# Patient Record
Sex: Male | Born: 2000
Health system: Southern US, Community
[De-identification: ages and names within clinical notes are randomized; demographics above are authoritative.]

---
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
## 2020-04-20 ENCOUNTER — Other Ambulatory Visit: Payer: No Typology Code available for payment source

## 2020-04-20 DIAGNOSIS — Z20822 Contact with and (suspected) exposure to covid-19: Secondary | ICD-10-CM

## 2020-04-22 LAB — SARS-COV-2, NAA 2 DAY TAT

## 2020-04-22 LAB — NOVEL CORONAVIRUS, NAA: SARS-CoV-2, NAA: NOT DETECTED

## 2022-11-25 ENCOUNTER — Ambulatory Visit
Admission: RE | Admit: 2022-11-25 | Discharge: 2022-11-25 | Disposition: A | Discharge status: Home / Self Care | Payer: 59 | Source: Ambulatory Visit | Attending: Family Medicine | Admitting: Family Medicine

## 2022-11-25 VITALS — BP 140/100 | HR 97 | Temp 99.9°F | Resp 17

## 2022-11-25 DIAGNOSIS — J029 Acute pharyngitis, unspecified: Secondary | ICD-10-CM

## 2022-11-25 DIAGNOSIS — R6889 Other general symptoms and signs: Secondary | ICD-10-CM

## 2022-11-25 DIAGNOSIS — R509 Fever, unspecified: Secondary | ICD-10-CM

## 2022-11-25 DIAGNOSIS — R059 Cough, unspecified: Secondary | ICD-10-CM

## 2022-11-25 LAB — POC INFLUENZA A AND B ANTIGEN (URGENT CARE ONLY)
Influenza A Ag: NEGATIVE
Influenza B Ag: NEGATIVE

## 2022-11-25 LAB — POC SARS CORONAVIRUS 2 AG -  ED: SARS Coronavirus 2 Ag: NEGATIVE

## 2022-11-25 MED ORDER — PROMETHAZINE-DM 6.25-15 MG/5ML PO SYRP: 5.0000 mL | ORAL_SOLUTION | Freq: Two times a day (BID) | ORAL | 0 refills | Status: AC | PRN

## 2022-11-25 MED ORDER — BENZONATATE 200 MG PO CAPS: 200.0000 mg | ORAL_CAPSULE | Freq: Three times a day (TID) | ORAL | 0 refills | Status: AC | PRN

## 2022-11-25 NOTE — ED Triage Notes (Signed)
Pt c/o cough, headache, fever, and bodyaches x 3 days. Tmax fever 101.4 yesterday evening. COVID test neg at home last night. Taking tylenol/ibuprofen and mucinex nighttime prn.

## 2022-11-25 NOTE — Discharge Instructions (Addendum)
Advised patient may take Tessalon daily or as needed for cough.  Advised may use Promethazine DM at night prior to sleep for cough due to sedative effects.  Advised patient/mother may take Tylenol 1 g every 6 hours for fever.  Encouraged increase daily water intake to 64 ounces per day while taking these medications.

## 2022-11-25 NOTE — ED Provider Notes (Signed)
[facility]    CSN: [id number] Arrival date & time: 11/25/22  1842      History   Chief Complaint Chief Complaint  Patient presents with   Headache   Fever   Generalized Body Aches   Cough    HPI Jeffrey Hensley is a 22 y.o. male.   HPI 22 year old male presents with cough, headache, fever and bodyaches for 3 days with temperature max being 101.4 yesterday evening.  Reports home COVID-19 test was negative. Patient is accompanied by his Mother this evening.  Past Medical History:  Diagnosis Date   Asthma     Patient Active Problem List   Diagnosis Date Noted   Ingrown toenail 11/25/2016   OTITIS EXTERNA, ACUTE NEC 03/09/2007   ASTHMA, UNSPECIFIED 09/24/2006    History reviewed. No pertinent surgical history.     Home Medications    Prior to Admission medications   Medication Sig Start Date End Date Taking? Authorizing Provider  benzonatate (TESSALON) 200 MG capsule Take 1 capsule (200 mg total) by mouth 3 (three) times daily as needed for up to 7 days. 11/25/22 12/02/22 Yes Trevor Iha, FNP  promethazine-dextromethorphan (PROMETHAZINE-DM) 6.25-15 MG/5ML syrup Take 5 mLs by mouth 2 (two) times daily as needed for cough. 11/25/22  Yes Trevor Iha, FNP  albuterol (PROVENTIL HFA;VENTOLIN HFA) 108 (90 Base) MCG/ACT inhaler Inhale into the lungs.    [provider]  CHILDRENS LORATADINE PO Take 1 tablet by mouth daily as needed. For allergies    [provider]  loratadine (CLARITIN) 10 MG tablet Take 1 tablet (10 mg total) by mouth daily. 03/13/12 03/13/13  [name], NP    Family History History reviewed. No pertinent family history.  Social History Social History   Tobacco Use   Smoking status: Never   Smokeless tobacco: Never  Substance Use Topics   Alcohol use: Never   Drug use: Never     Allergies   Amoxicillin   Review of Systems Review of Systems  Constitutional:  Positive for fever.  Respiratory:  Positive  for cough.   Musculoskeletal:  Positive for arthralgias and myalgias.  All other systems reviewed and are negative.    Physical Exam Triage Vital Signs ED Triage Vitals  Enc Vitals Group     BP      Pulse      Resp      Temp      Temp src      SpO2      Weight      Height      Head Circumference      Peak Flow      Pain Score      Pain Loc      Pain Edu?      Excl. in GC?    No data found.  Updated Vital Signs BP (!) 140/100 (BP Location: Left Arm)   Pulse 97   Temp 99.9 F (37.7 C) (Oral)   Resp 17   SpO2 96%    Physical Exam Vitals and nursing note reviewed.  Constitutional:      Appearance: Normal appearance. He is normal weight. He is ill-appearing.  HENT:     Head: Normocephalic and atraumatic.     Right Ear: Tympanic membrane, ear canal and external ear normal.     Left Ear: Tympanic membrane, ear canal and external ear normal.     Nose:     Comments: Turbinates are erythematous/edematous    Mouth/Throat:  Mouth: Mucous membranes are moist.     Pharynx: Oropharynx is clear.  Eyes:     Extraocular Movements: Extraocular movements intact.     Conjunctiva/sclera: Conjunctivae normal.     Pupils: Pupils are equal, round, and reactive to light.  Cardiovascular:     Rate and Rhythm: Normal rate and regular rhythm.     Pulses: Normal pulses.     Heart sounds: Normal heart sounds.  Pulmonary:     Effort: Pulmonary effort is normal.     Breath sounds: Normal breath sounds. No wheezing, rhonchi or rales.     Comments: Frequent nonproductive cough noted on exam Musculoskeletal:        General: Normal range of motion.     Cervical back: Normal range of motion and neck supple.  Skin:    General: Skin is warm and dry.  Neurological:     General: No focal deficit present.     Mental Status: He is alert and oriented to person, place, and time. Mental status is at baseline.      UC Treatments / Results  Labs (all labs ordered are listed, but only  abnormal results are displayed) Labs Reviewed  CULTURE, GROUP A STREP ([facility])  POC SARS CORONAVIRUS 2 AG -  ED  POC INFLUENZA A AND B ANTIGEN (URGENT CARE ONLY)  POCT RAPID STREP A (OFFICE)    EKG   Radiology No results found.  Procedures Procedures (including critical care time)  Medications Ordered in UC Medications - No data to display  Initial Impression / Assessment and Plan / UC Course  I have reviewed the triage vital signs and the nursing notes.  Pertinent labs & imaging results that were available during my care of the patient were reviewed by me and considered in my medical decision making (see chart for details).     MDM: 1.  Flulike symptoms-POCT influenza/COVID-19 negative; 2.  Cough-Rx'd Tessalon 3 times daily, as needed as needed for cough, Promethazine DM 6.25-15 mg / 5 mL syrup take 5 mL twice daily for cough; 3.  Pharyngitis-rapid strep negative, throat culture ordered. Advised patient may take Tessalon daily or as needed for cough.  Advised may use Promethazine DM at night prior to sleep for cough due to sedative effects.  4. Fever-Advised patient/mother may take Tylenol 1 g every 6 hours for fever.  Encouraged increase daily water intake to 64 ounces per day while taking these medications.  2-day work note provided to patient prior to discharge.  Patient discharged home, hemodynamically stable. Final Clinical Impressions(s) / UC Diagnoses   Final diagnoses:  Flu-like symptoms  Cough, unspecified type  Fever, unspecified  Acute pharyngitis, unspecified etiology     Discharge Instructions      Advised patient may take Tessalon daily or as needed for cough.  Advised may use Promethazine DM at night prior to sleep for cough due to sedative effects.  Advised patient/mother may take Tylenol 1 g every 6 hours for fever.  Encouraged increase daily water intake to 64 ounces per day while taking these medications.     ED Prescriptions     Medication Sig  Dispense Auth. Provider   benzonatate (TESSALON) 200 MG capsule Take 1 capsule (200 mg total) by mouth 3 (three) times daily as needed for up to 7 days. 40 capsule Trevor Iha, FNP   promethazine-dextromethorphan (PROMETHAZINE-DM) 6.25-15 MG/5ML syrup Take 5 mLs by mouth 2 (two) times daily as needed for cough. 118 mL Trevor Iha, FNP  PDMP not reviewed this encounter.   Trevor Iha, FNP 11/25/22 2012

## 2023-08-04 ENCOUNTER — Other Ambulatory Visit: Payer: Self-pay

## 2023-08-04 ENCOUNTER — Emergency Department (HOSPITAL_BASED_OUTPATIENT_CLINIC_OR_DEPARTMENT_OTHER)
Admission: EM | Admit: 2023-08-04 | Discharge: 2023-08-04 | Disposition: A | Discharge status: Home / Self Care | Payer: Self-pay | Attending: Emergency Medicine | Admitting: Emergency Medicine

## 2023-08-04 ENCOUNTER — Other Ambulatory Visit (HOSPITAL_BASED_OUTPATIENT_CLINIC_OR_DEPARTMENT_OTHER): Payer: Self-pay

## 2023-08-04 ENCOUNTER — Encounter (HOSPITAL_BASED_OUTPATIENT_CLINIC_OR_DEPARTMENT_OTHER): Payer: Self-pay | Admitting: Emergency Medicine

## 2023-08-04 DIAGNOSIS — Z7951 Long term (current) use of inhaled steroids: Secondary | ICD-10-CM | POA: Insufficient documentation

## 2023-08-04 DIAGNOSIS — Z20822 Contact with and (suspected) exposure to covid-19: Secondary | ICD-10-CM | POA: Insufficient documentation

## 2023-08-04 DIAGNOSIS — J45909 Unspecified asthma, uncomplicated: Secondary | ICD-10-CM | POA: Insufficient documentation

## 2023-08-04 DIAGNOSIS — J069 Acute upper respiratory infection, unspecified: Secondary | ICD-10-CM | POA: Insufficient documentation

## 2023-08-04 LAB — RESP PANEL BY RT-PCR (RSV, FLU A&B, COVID)  RVPGX2
Influenza A by PCR: NEGATIVE
Influenza B by PCR: NEGATIVE
Resp Syncytial Virus by PCR: NEGATIVE
SARS Coronavirus 2 by RT PCR: NEGATIVE

## 2023-08-04 LAB — GROUP A STREP BY PCR: Group A Strep by PCR: NOT DETECTED

## 2023-08-04 MED ORDER — CETIRIZINE-PSEUDOEPHEDRINE ER 5-120 MG PO TB12
1.0000 | ORAL_TABLET | Freq: Every day | ORAL | 0 refills | Status: AC | PRN
  Filled 2023-08-04: qty 24, 24d supply, fill #0

## 2023-08-04 MED ORDER — TRIAMCINOLONE ACETONIDE 55 MCG/ACT NA AERO
2.0000 | INHALATION_SPRAY | Freq: Every day | NASAL | 12 refills | Status: AC
  Filled 2023-08-04: qty 16.9, 30d supply, fill #0

## 2023-08-04 MED ORDER — BENZONATATE 100 MG PO CAPS
100.0000 mg | ORAL_CAPSULE | Freq: Three times a day (TID) | ORAL | 0 refills | Status: AC | PRN
  Filled 2023-08-04: qty 21, 7d supply, fill #0

## 2023-08-04 NOTE — Discharge Instructions (Addendum)
 You tested negative for strep throat.  Suspect your symptoms are likely secondary to upper respiratory viral infection.  Follow MyChart for the results of your COVID, flu, RSV testing.  Will recommend treatment of your symptoms at home with allergy medicine such as Zyrtec /Claritin Blair.  Will also recommend nasal steroid spray such as Nasacort /Flonase.  You may take Tylenol/ibuprofen  for your body aches as well as any fever.  Will also send in cough suppressant to use as needed.  Please do not hesitate to return if the worrisome signs and symptoms we discussed become apparent.

## 2023-08-04 NOTE — ED Triage Notes (Signed)
 Cogh runny nose and fever and tired since sat

## 2023-08-04 NOTE — ED Provider Notes (Signed)
 [facility] Provider Note   CSN: [id number] Arrival date & time: 08/04/23  1138     History  Chief Complaint  Patient presents with   URI    Jeffrey Hensley is a 23 y.o. male.   URI    23 year old male presents emergency department complaints of cough, nasal congestion, sore throat, body aches.  Symptoms present since Saturday.  Patient with significant other who is ill with similar symptoms.  Reports multiple potential sick exposures.  Denies any fever, chest pain, shortness of breath, abdominal pain, nausea vomiting of your symptoms, change in bowel habits.  Has tried DayQuil with some relief of symptoms.  Presents emergency department for further assessment/evaluation.   Past medical history significant for asthma  Home Medications Prior to Admission medications   Medication Sig Start Date End Date Taking? Authorizing Provider  benzonatate  (TESSALON ) 100 MG capsule Take 1 capsule (100 mg total) by mouth 3 (three) times daily as needed. 08/04/23  Yes Silver Fell A, PA  cetirizine -pseudoephedrine  (ZYRTEC -D) 5-120 MG tablet Take 1 tablet by mouth daily as needed for allergies. 08/04/23  Yes Silver Fell A, PA  triamcinolone  (NASACORT ) 55 MCG/ACT AERO nasal inhaler Place 2 sprays into the nose daily. 08/04/23  Yes Silver Fell A, PA  albuterol  (PROVENTIL  HFA;VENTOLIN  HFA) 108 (90 Base) MCG/ACT inhaler Inhale into the lungs.    [provider]  loratadine  (CLARITIN ) 10 MG tablet Take 1 tablet (10 mg total) by mouth daily. 03/13/12 03/13/13  Towana Dedra CROME, NP  promethazine -dextromethorphan (PROMETHAZINE -DM) 6.25-15 MG/5ML syrup Take 5 mLs by mouth 2 (two) times daily as needed for cough. 11/25/22   Teddy Sharper, FNP      Allergies    Amoxicillin    Review of Systems   Review of Systems  All other systems reviewed and are negative.   Physical Exam Updated Vital Signs BP (!) 151/81 (BP Location: Right Arm)   Pulse 78    Temp 98.3 F (36.8 C) (Oral)   Resp 16   Ht 6' 2 (1.88 m)   Wt 119.7 kg   SpO2 95%   BMI 33.90 kg/m  Physical Exam Vitals and nursing note reviewed.  Constitutional:      General: He is not in acute distress.    Appearance: He is well-developed.  HENT:     Head: Normocephalic and atraumatic.     Right Ear: Tympanic membrane normal.     Left Ear: Tympanic membrane normal.     Nose: Congestion and rhinorrhea present.     Mouth/Throat:     Comments: Mild posterior pharyngeal erythema.  Uvula midline rise symmetric with phonation.  No sublingual or submandibular swelling.  Tonsils 1+ bilaterally without exudate.  No trismus. Eyes:     Conjunctiva/sclera: Conjunctivae normal.  Cardiovascular:     Rate and Rhythm: Normal rate and regular rhythm.     Pulses: Normal pulses.     Heart sounds: No murmur heard. Pulmonary:     Effort: Pulmonary effort is normal. No respiratory distress.     Breath sounds: Normal breath sounds. No wheezing, rhonchi or rales.  Abdominal:     Palpations: Abdomen is soft.     Tenderness: There is no abdominal tenderness.  Musculoskeletal:        General: No swelling.     Cervical back: Neck supple.  Skin:    General: Skin is warm and dry.     Capillary Refill: Capillary refill takes less than 2  seconds.  Neurological:     Mental Status: He is alert.  Psychiatric:        Mood and Affect: Mood normal.     ED Results / Procedures / Treatments   Labs (all labs ordered are listed, but only abnormal results are displayed) Labs Reviewed  RESP PANEL BY RT-PCR (RSV, FLU A&B, COVID)  [id number]  GROUP A STREP BY PCR    EKG None  Radiology No results found.  Procedures Procedures    Medications Ordered in ED Medications - No data to display  ED Course/ Medical Decision Making/ A&P                                 Medical Decision Making Risk OTC drugs. Prescription drug management.   This patient presents to the ED for concern of cough,  congestion, body aches, this involves an extensive number of treatment options, and is a complaint that carries with it a high risk of complications and morbidity.  The differential diagnosis includes, flu, RSV, viral URI, pneumonia, strep pharyngitis, Ludwig angina, PTA, other   Co morbidities that complicate the patient evaluation  See HPI   Additional history obtained:  Additional history obtained from EMR External records from outside source obtained and reviewed including hospital records   Lab Tests:  I Ordered, and personally interpreted labs.  The pertinent results include: Strep, COVID, flu, RSV negative   Imaging Studies ordered:  N/a   Cardiac Monitoring: / EKG:  The patient was maintained on a cardiac monitor.  I personally viewed and interpreted the cardiac monitored which showed an underlying rhythm of: sinus rhythm   Consultations Obtained:  N/a   Problem List / ED Course / Critical interventions / Medication management  Viral URI with cough Reevaluation of the patient showed that the patient stayed the same I have reviewed the patients home medicines and have made adjustments as needed   Social Determinants of Health:  Denies tobacco, illicit drug use   Test / Admission - Considered:  Viral URI with cough Vitals signs within normal range and stable throughout visit. Laboratory/imaging studies significant for: See above 23 year old male presents emergency department with complaints of nasal congestion, cough, sore throat.  Symptom onset Saturday.  Multiple sick exposures and partner ill with similar symptoms.  Lungs clear to auscultation bilaterally without clinical evidence of pneumonia.  Mild posterior pharyngeal erythema consistent with viral pharyngitis.  No clinical evidence of Ludwig angina, Lemierre's disease, PTA.  Group A strep testing as well as viral testing negative.  Suspect the patient symptoms likely secondary to upper respiratory  viral infection.  Will recommend symptomatic therapy as described in AVS and close follow-up with primary care for reassessment.  Treatment plan discussed at length with patient and she acknowledged understanding was agreeable to said plan.  Patient overall well-appearing, afebrile in no acute distress. Worrisome signs and symptoms were discussed with the patient, and the patient acknowledged understanding to return to the ED if noticed. Patient was stable upon discharge.         Final Clinical Impression(s) / ED Diagnoses Final diagnoses:  Viral URI with cough    Rx / DC Orders      Silver Wonda LABOR, PA 08/04/23 1331    Elnor Hila P, DO 08/06/23 [phone]

## 2023-08-17 ENCOUNTER — Other Ambulatory Visit (HOSPITAL_BASED_OUTPATIENT_CLINIC_OR_DEPARTMENT_OTHER): Payer: Self-pay

## 2024-10-03 ENCOUNTER — Ambulatory Visit: Payer: Self-pay | Admitting: Family Medicine
